# Patient Record
Sex: Male | Born: 1995 | Race: White | Hispanic: No | Marital: Single
Health system: Southern US, Community
[De-identification: ages and names within clinical notes are randomized; demographics above are authoritative.]

---
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
## 2013-04-25 IMAGING — CR DG CHEST 2V
1 series · 2 of 2 positions shown · non-contrast
Comparison: none

REASON FOR EXAM: r/o pneumonia
COMMENTS:

[Series 1: w chest pa · 0.14mm/px · 2 of 2 slices shown]
[im 1/2]
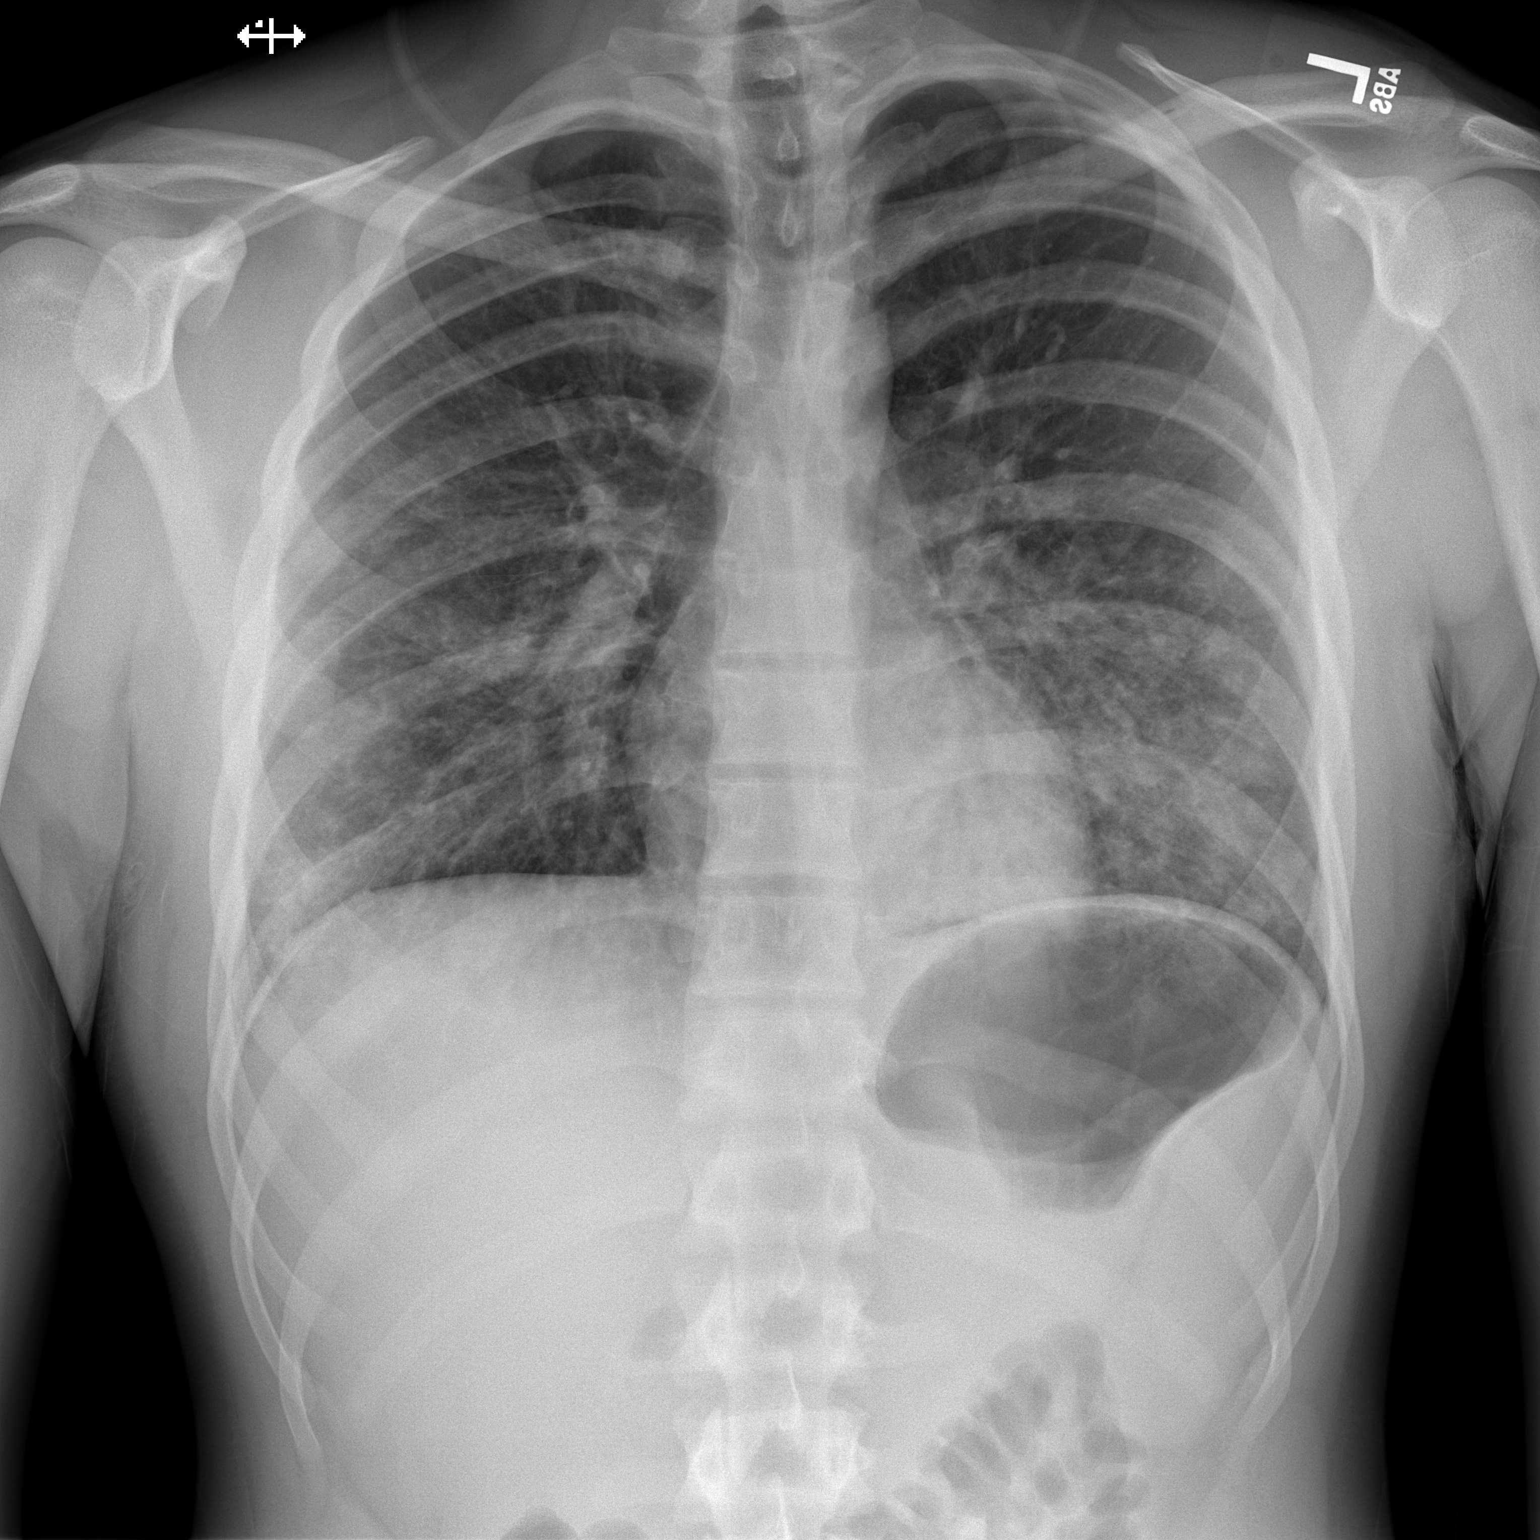
[im 2/2]
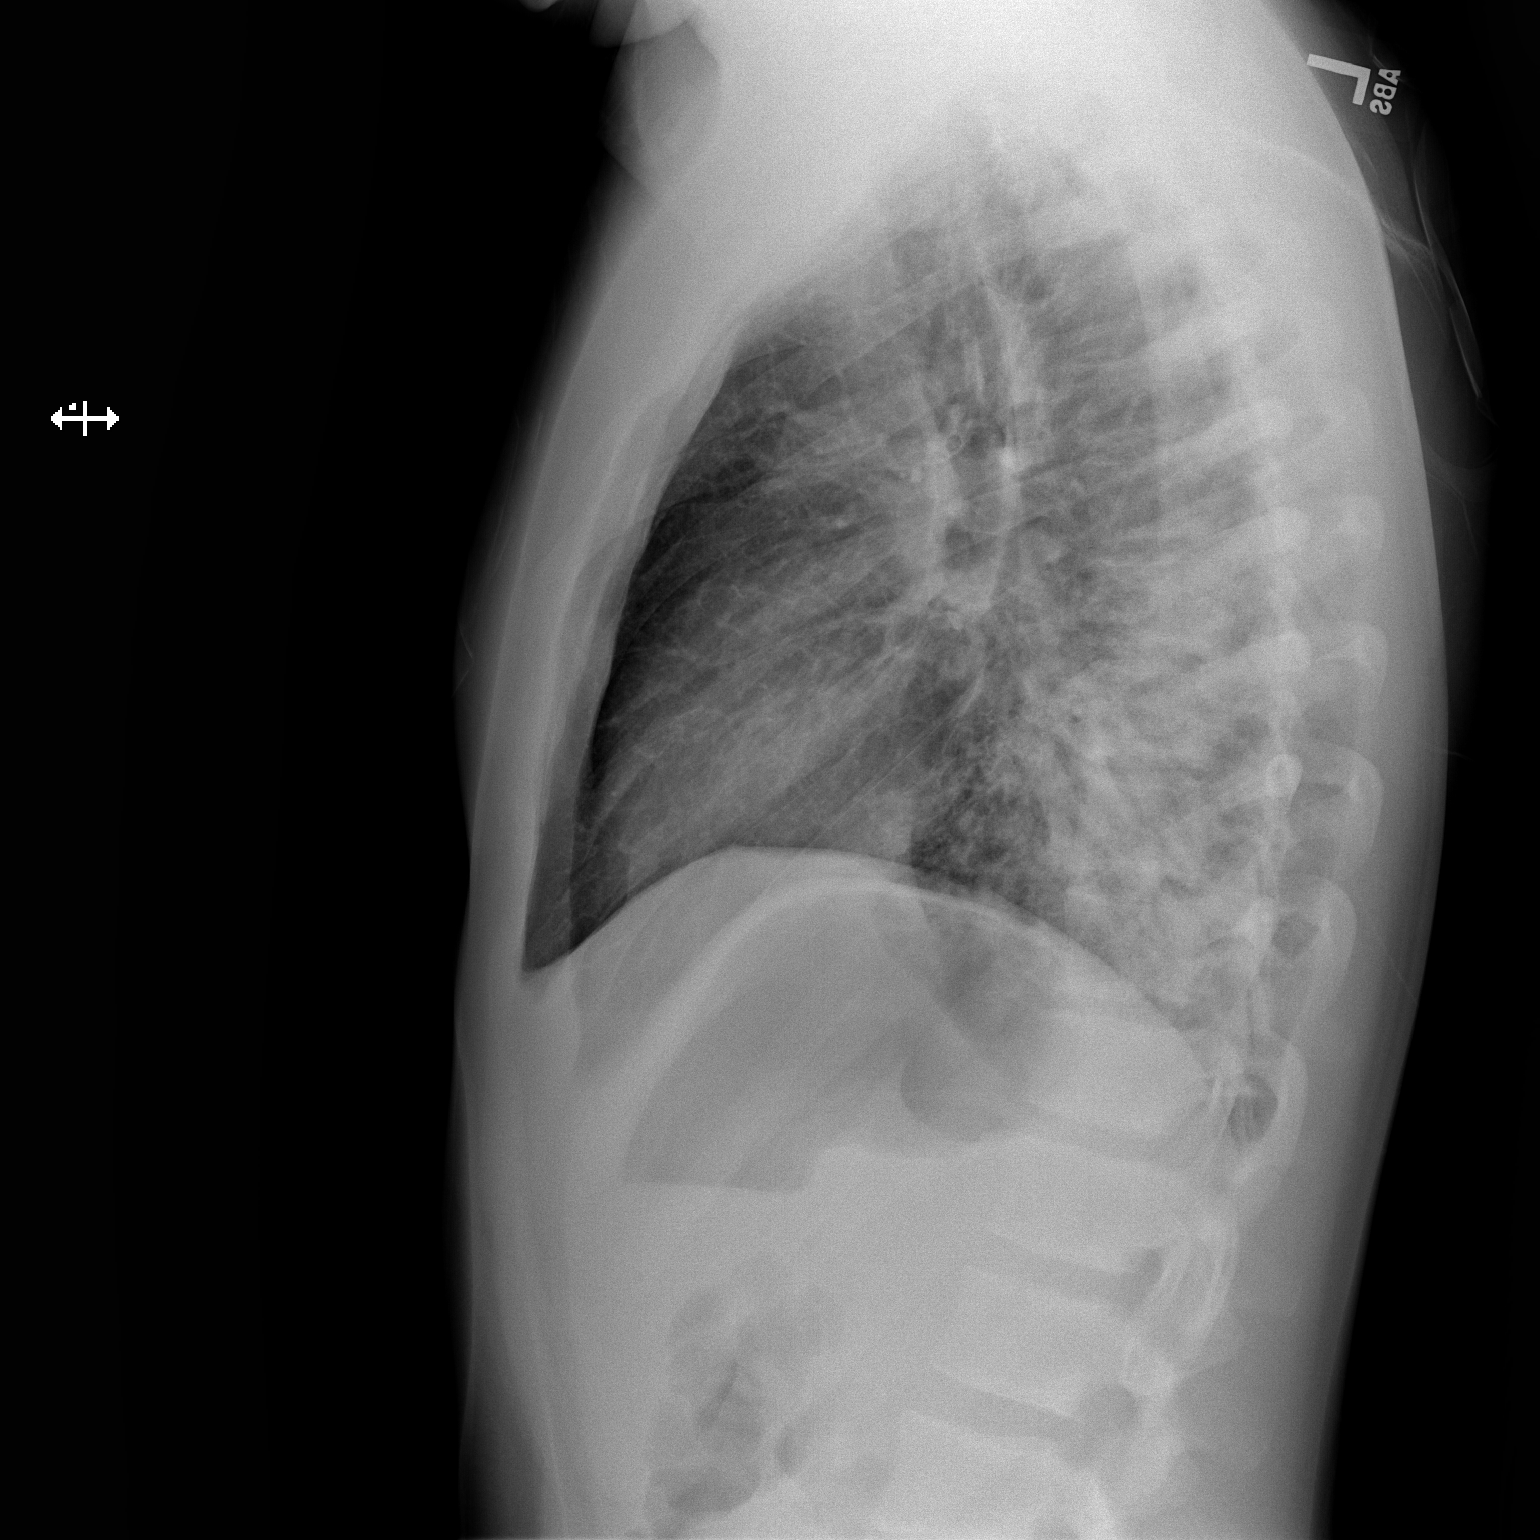

[2 of 2 positions shown; findings below may reference images not displayed]

PROCEDURE:     DXR - DXR CHEST PA (OR AP) AND LATERAL  - September 07, 2011  [DATE]

RESULT:     Two views of the chest are obtained. The patient has no previous
exam for comparison.

There is increased cortical density in both lower lobe regions worse on the
left consistent with pneumonia. The lungs are otherwise clear. Cardiac
silhouette is normal. The bony structures are unremarkable.
IMPRESSION: Basilar pneumonia predominantly in the left lower lobe.

[REDACTED]

## 2014-09-07 NOTE — H&P (Signed)
Subjective/Chief Complaint Cough, fever, achiness for 1 and 1/2 weeks--fever worse the past 2 days    History of Present Illness Basil started experiencing nasal congestion, sore throat and mild aches about 1 and 1/2 weeks prior to hospitalization.  He was seen initially at a local urgent care.  He reportedly had a negative rapid strep and negative flu test.  His WBC was 13k.  He was placed on oral Augmentin.  He then developed nausea and diarrhea over the next several days, and developed low grade fever a few days later.  He presented to [facility] two days prior to admission.  He had no fever, WBC 14k, 65% segs.  Diarrhea atributed to Augmentin so was changed to [facility].  He continue to have cough, nasal congestion and fever spikes to 102-103 over the next two days.  He returned to [facility] on day of admission,a febrile but diaphoretic, weak feeling.  )2 sat 86-88% on RA with decreased breath sound bilat.    Past History H/O nasal allergies, no history of wheezing or pneumonia.  H/O prolonged gastritis  in 2009.    Primary Physician [facility], Dr. Ilda Mori   Family and Social History:   Family History Non-Contributory    Social History negative tobacco    Place of Living Home   Review of Systems:   Fever/Chills Yes    Cough Yes    Diarrhea Yes    Nausea/Vomiting Yes    SOB/DOE Yes  No    Chest Pain No    Tolerating Diet Yes  Drinking well    Medications/Allergies Reviewed Medications/Allergies reviewed  on Omnicef, NKDA   Physical Exam:   GEN diaphoretic    HEENT moist oral mucosa, Oropharynx clear, nasal congestion    NECK supple    RESP no use of accessory muscles  decreased breath sounds bilat    CARD no murmur  tachycardic HR 144    ABD denies tenderness  normal BS    LYMPH negative neck   Routine Hem:  24-Apr-13 16:38    WBC (CBC) 16.6   RBC (CBC) 5.21   Hemoglobin (CBC) 13.8   Hematocrit (CBC) 41.3   Platelet Count (CBC) 374    MCV 79   MCH 26.4   MCHC 33.4   RDW 14.1   Neutrophil % 85.5   Lymphocyte % 8.5   Monocyte % 4.6   Eosinophil % 1.2   Basophil % 0.2   Neutrophil # 14.2   Lymphocyte # 1.4   Monocyte # 0.8   Eosinophil # 0.2   Basophil # 0.0  Routine Chem:  24-Apr-13 16:38    Glucose, Serum 96   BUN 13   Creatinine (comp) 0.91   Sodium, Serum 133   Potassium, Serum 4.0   Chloride, Serum 97   CO2, Serum 25   Calcium (Total), Serum 8.9  Hepatic:  24-Apr-13 16:38    Bilirubin, Total 0.5   Alkaline Phosphatase 125   SGPT (ALT) 74   SGOT (AST) 68   Total Protein, Serum 7.7   Albumin, Serum 2.9  Routine Chem:  24-Apr-13 16:38    Osmolality (calc) 266   eGFR (African American) >60   eGFR (Non-African American) >60   Anion Gap 11   Radiology Results: XRay:    24-Apr-13 16:09, Chest PA and Lateral   Chest PA and Lateral   REASON FOR EXAM:    r/o pneumonia  COMMENTS:       PROCEDURE: DXR -  DXR CHEST PA (OR AP) AND LATERAL  - Sep 07 2011  4:09PM     RESULT: Two views of the chest are obtained. The patient has no previous   exam for comparison.    There is increased cortical density in both lower lobe regions worse on   the left consistent with pneumonia. The lungs are otherwise clear.   Cardiac silhouette is normal. The bony structures are unremarkable.    IMPRESSION:  Basilar pneumonia predominantly in the left lower lobe.    Dictation Site: 2    Verified By: Sundra Aland, M.D., MD     Assessment/Admission Diagnosis Pneumonia, hypoxia    Plan oxygen via [place] to keep sat > 92% MIVF IV Azithromycin and Ceftriaxone   Electronic Signatures: Armandina Gemma (MD)  (Signed 24-Apr-13 18:04)  Authored: CHIEF COMPLAINT and HISTORY, FAMILY AND SOCIAL HISTORY, REVIEW OF SYSTEMS, PHYSICAL EXAM, LABS, Radiology, ASSESSMENT AND PLAN   Last Updated: 24-Apr-13 18:04 by Armandina Gemma (MD)
